# Patient Record
Sex: Female | Born: 1985 | Race: Black or African American | Hispanic: No | Marital: Married | Smoking: Never smoker
Health system: Southern US, Community
[De-identification: ages and names within clinical notes are randomized; demographics above are authoritative.]

## PROBLEM LIST (undated history)

## (undated) DIAGNOSIS — K219 Gastro-esophageal reflux disease without esophagitis: Secondary | ICD-10-CM

## (undated) DIAGNOSIS — I10 Essential (primary) hypertension: Secondary | ICD-10-CM

---

## 2016-07-05 ENCOUNTER — Encounter (HOSPITAL_BASED_OUTPATIENT_CLINIC_OR_DEPARTMENT_OTHER): Payer: Self-pay

## 2016-07-05 ENCOUNTER — Emergency Department (HOSPITAL_BASED_OUTPATIENT_CLINIC_OR_DEPARTMENT_OTHER): Payer: Worker's Compensation

## 2016-07-05 ENCOUNTER — Emergency Department (HOSPITAL_BASED_OUTPATIENT_CLINIC_OR_DEPARTMENT_OTHER)
Admission: EM | Admit: 2016-07-05 | Discharge: 2016-07-05 | Disposition: A | Discharge status: Home / Self Care | Payer: Worker's Compensation | Attending: Emergency Medicine | Admitting: Emergency Medicine

## 2016-07-05 DIAGNOSIS — W228XXA Striking against or struck by other objects, initial encounter: Secondary | ICD-10-CM | POA: Insufficient documentation

## 2016-07-05 DIAGNOSIS — I10 Essential (primary) hypertension: Secondary | ICD-10-CM | POA: Diagnosis not present

## 2016-07-05 DIAGNOSIS — M79641 Pain in right hand: Secondary | ICD-10-CM | POA: Diagnosis not present

## 2016-07-05 DIAGNOSIS — Y990 Civilian activity done for income or pay: Secondary | ICD-10-CM | POA: Insufficient documentation

## 2016-07-05 DIAGNOSIS — Y929 Unspecified place or not applicable: Secondary | ICD-10-CM | POA: Diagnosis not present

## 2016-07-05 DIAGNOSIS — Y9389 Activity, other specified: Secondary | ICD-10-CM | POA: Insufficient documentation

## 2016-07-05 HISTORY — DX: Essential (primary) hypertension: I10

## 2016-07-05 NOTE — ED Provider Notes (Signed)
[facility] DEPT Provider Note   CSN: [id number] Arrival date & time: 07/05/16  1240     History   Chief Complaint Chief Complaint  Patient presents with  . Hand Injury    HPI Emma Benjamin is a 30 y.o. female.  30 year old female hit her right hand on a window when she threw a newspaper outside of it. She has pain in the medial side of the right hand over the fifth metacarpal. She has minimal swelling but no ecchymosis. No decrease in range of motion aside from that which is associated with the pain. No injuries elsewhere. Hasn't taken anything for the symptoms. No exacerbating or relieving factors. No other associated symptoms.      Past Medical History:  Diagnosis Date  . Hypertension     There are no active problems to display for this patient.   Past Surgical History:  Procedure Laterality Date  . CESAREAN SECTION      OB History    No data available       Home Medications    Prior to Admission medications   Not on File    Family History No family history on file.  Social History Social History  Substance Use Topics  . Smoking status: Never Smoker  . Smokeless tobacco: Never Used  . Alcohol use No     Allergies   Flagyl [metronidazole]   Review of Systems Review of Systems  All other systems reviewed and are negative.    Physical Exam Updated Vital Signs BP 142/93 (BP Location: Left Arm)   Pulse 72   Temp 98.2 F (36.8 C) (Oral)   Resp 16   Ht 5\' 8"  (1.727 m)   Wt 180 lb 4 oz (81.8 kg)   LMP 05/29/2016   SpO2 100%   BMI 27.41 kg/m   Physical Exam  Constitutional: She is oriented to person, place, and time. She appears well-developed and well-nourished.  HENT:  Head: Normocephalic and atraumatic.  Neck: Normal range of motion.  Cardiovascular: Normal rate and regular rhythm.   Pulmonary/Chest: No stridor. No respiratory distress.  Abdominal: She exhibits no distension.  Musculoskeletal: Normal range of motion.  She exhibits tenderness (over fifth metacarpal of right hand). She exhibits no edema.  Neurological: She is alert and oriented to person, place, and time.  Able to fully flex/extend fingers on right hand. Normal sensation as well.   Skin: Skin is warm and dry.  Nursing note and vitals reviewed.    ED Treatments / Results  Labs (all labs ordered are listed, but only abnormal results are displayed) Labs Reviewed - No data to display  EKG  EKG Interpretation None       Radiology Dg Hand Complete Right  Result Date: 07/05/2016 CLINICAL DATA:  Blunt trauma right hand with fifth metacarpal pain, initial encounter EXAM: RIGHT HAND - COMPLETE 3+ VIEW COMPARISON:  None. FINDINGS: There is no evidence of fracture or dislocation. There is no evidence of arthropathy or other focal bone abnormality. Soft tissues are unremarkable. IMPRESSION: No acute abnormality noted. Electronically Signed   By: Alcide Clever M.D.   On: 07/05/2016 13:40    Procedures Procedures (including critical care time)  Medications Ordered in ED Medications - No data to display   Initial Impression / Assessment and Plan / ED Course  I have reviewed the triage vital signs and the nursing notes.  Pertinent labs & imaging results that were available during my care of the patient were reviewed by  me and considered in my medical decision making (see chart for details).  Clinical Course    X-ray without evidence of fracture however with exquisite tenderness was put in a splint and will follow-up in a week for repeat x-ray if still having pain.  Final Clinical Impressions(s) / ED Diagnoses   Final diagnoses:  Right hand pain    New Prescriptions There are no discharge medications for this patient.    Marily MemosJason Jachelle Fluty, MD 07/06/16 [phone]

## 2016-07-05 NOTE — ED Triage Notes (Signed)
Hit right hand on door frame at work approx 0430am today-NAD-steady gait

## 2016-07-05 NOTE — ED Notes (Signed)
Patient currently completing drug screen.

## 2016-07-05 NOTE — ED Notes (Signed)
UDS completed 

## 2017-10-13 IMAGING — CR DG HAND COMPLETE 3+V*R*
3 series · 3 of 3 positions shown · non-contrast
Comparison: None.

CLINICAL DATA: Blunt trauma right hand with fifth metacarpal pain,
initial encounter

EXAM:
RIGHT HAND - COMPLETE 3+ VIEW

[x hand pa right]
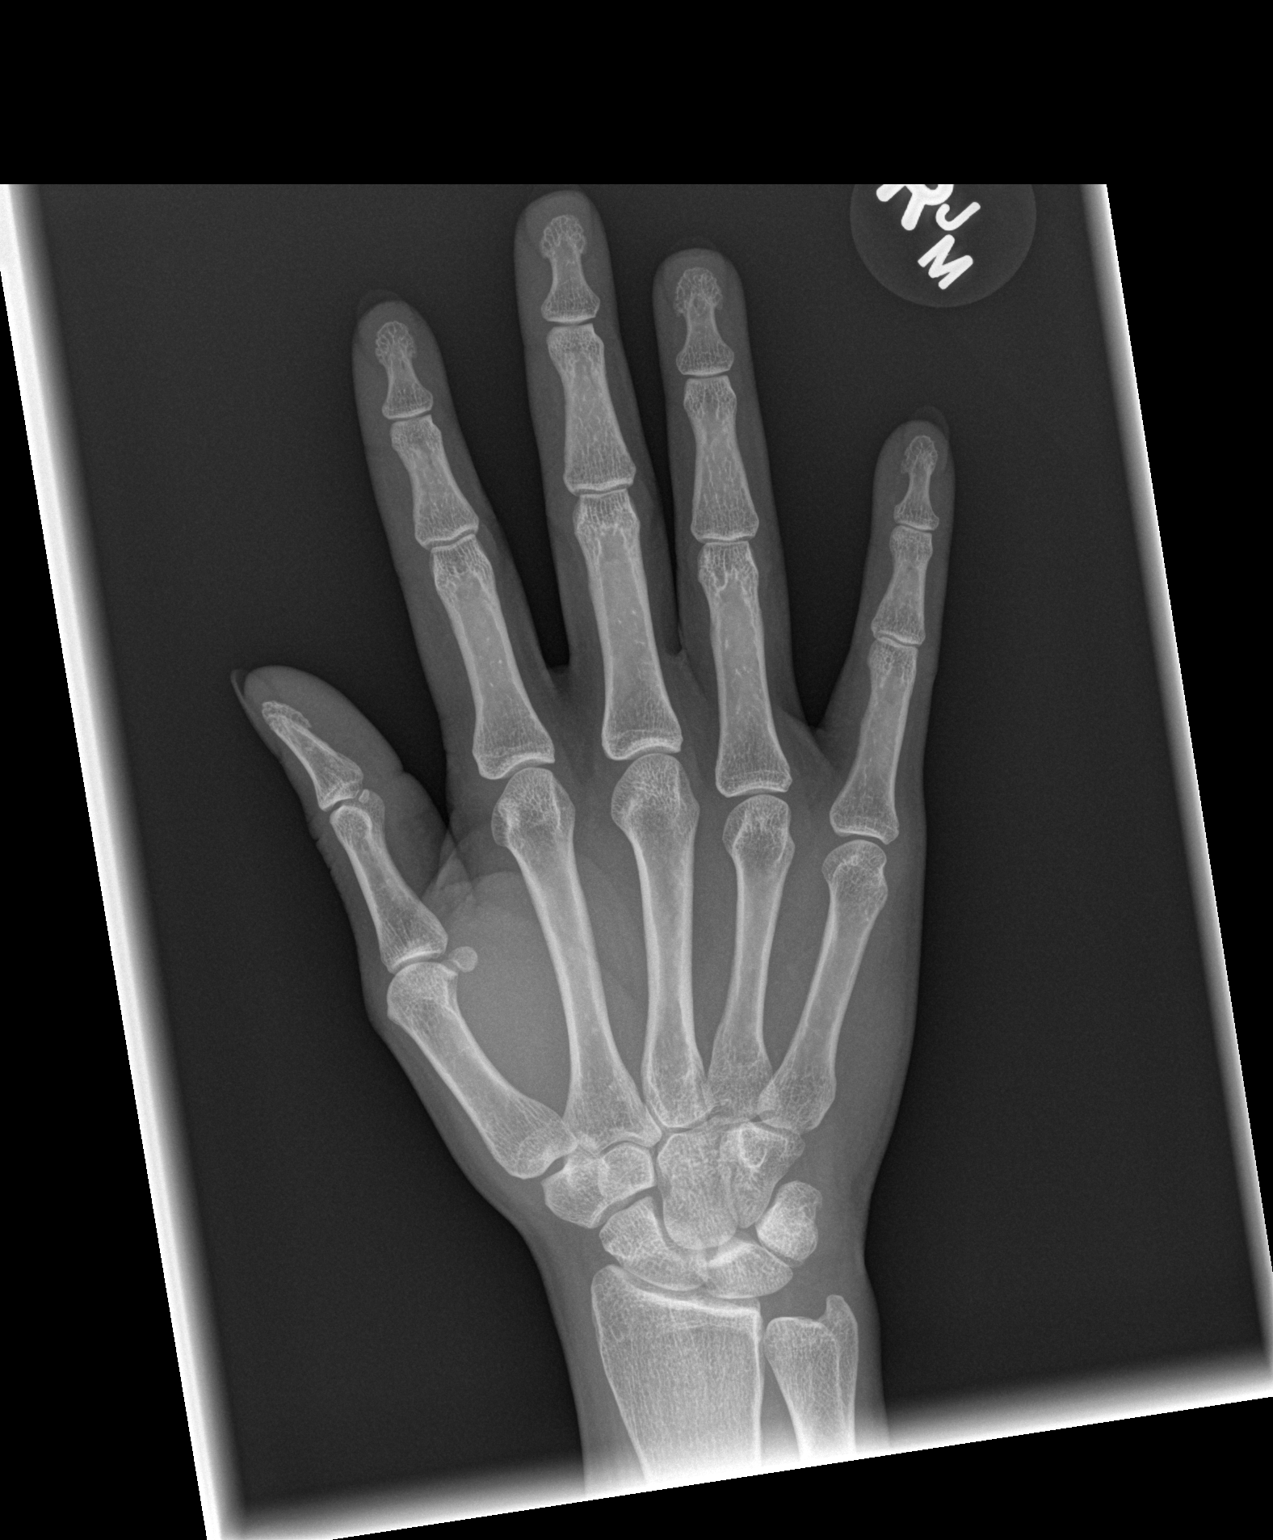

[x hand oblique right]
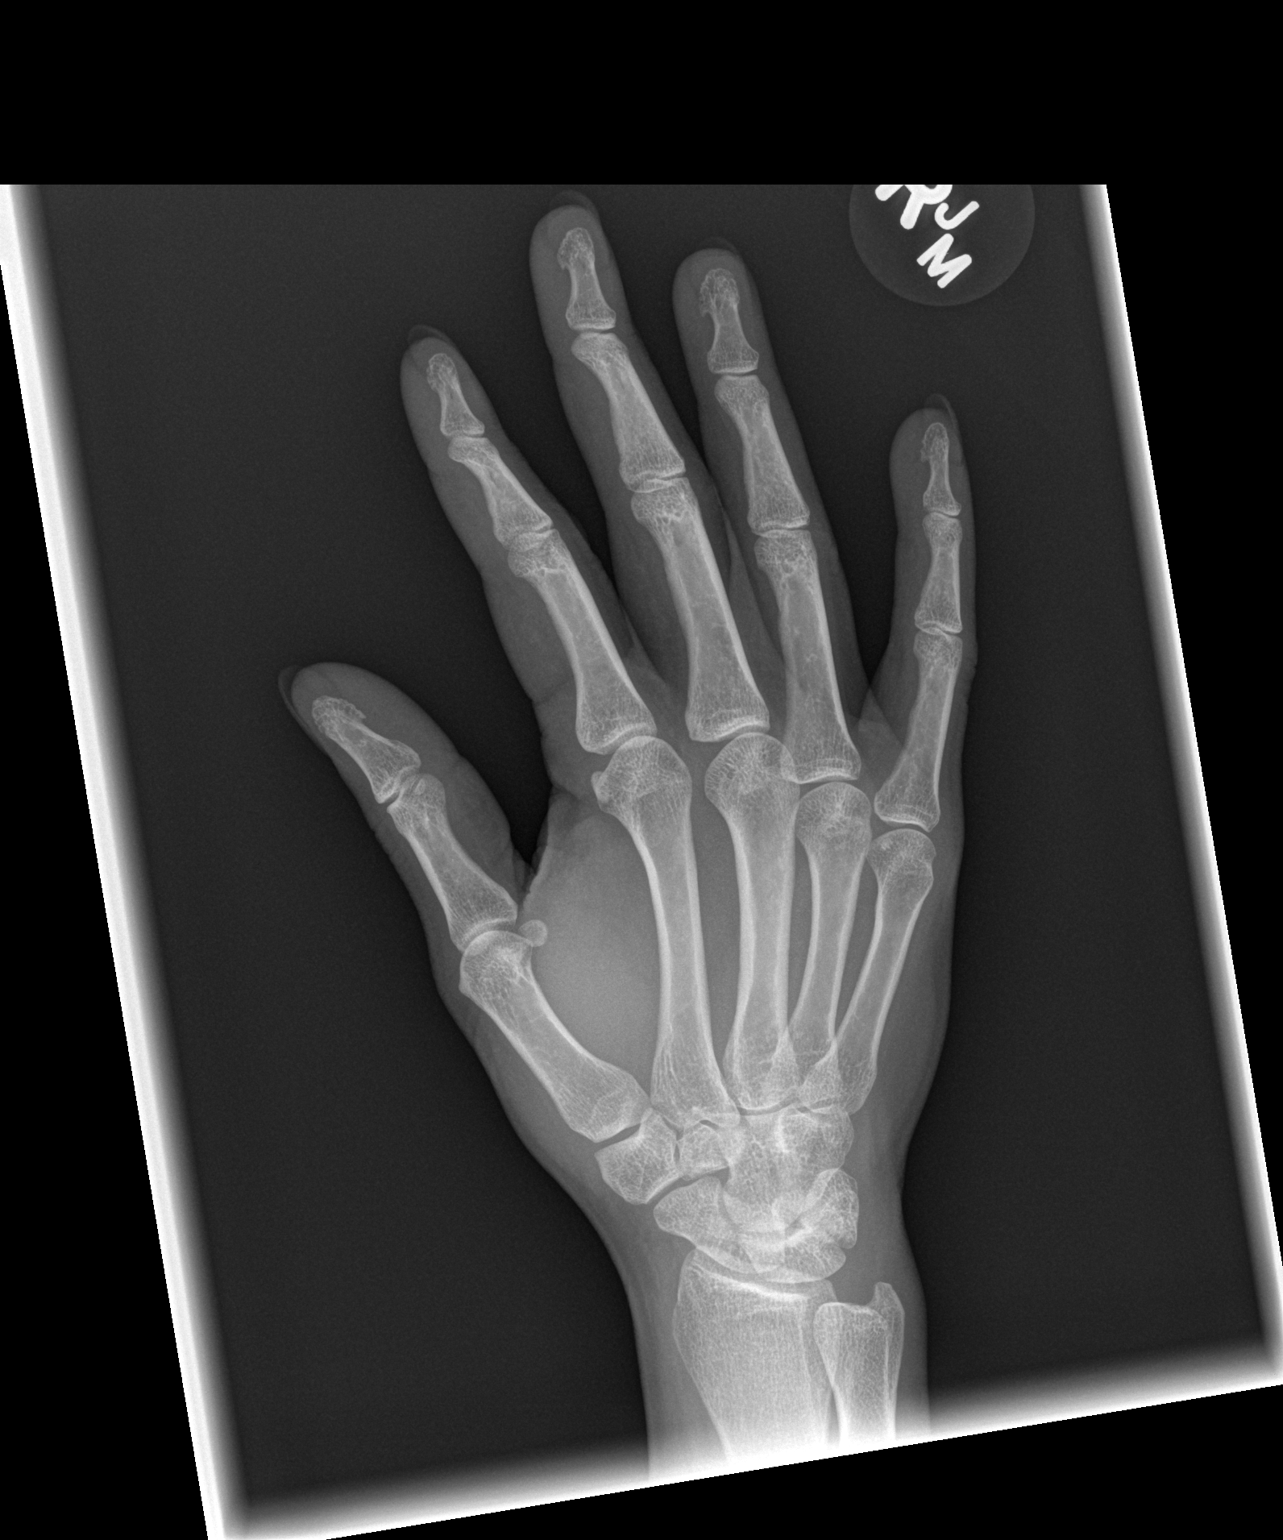

[x hand lat right]
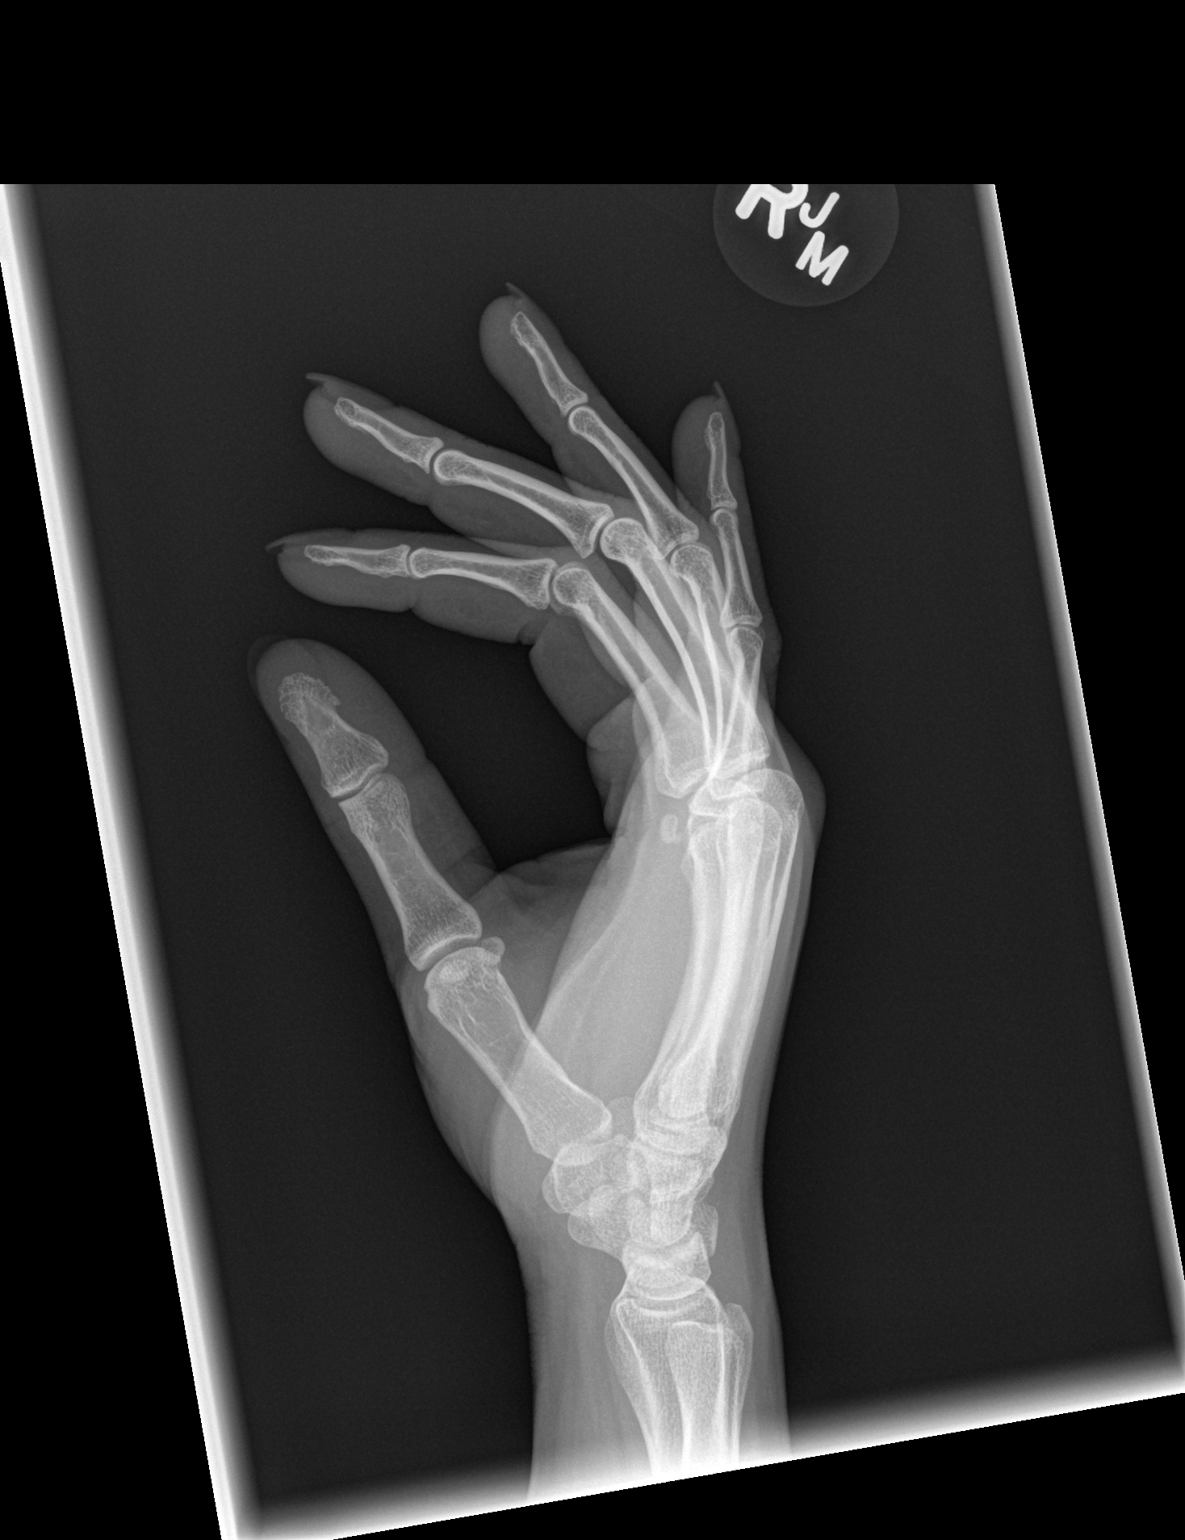

[3 of 3 positions shown; findings below may reference images not displayed]

FINDINGS: There is no evidence of fracture or dislocation. There is no
evidence of arthropathy or other focal bone abnormality. Soft
tissues are unremarkable.
IMPRESSION: No acute abnormality noted.

## 2021-02-05 ENCOUNTER — Emergency Department (HOSPITAL_BASED_OUTPATIENT_CLINIC_OR_DEPARTMENT_OTHER)
Admission: EM | Admit: 2021-02-05 | Discharge: 2021-02-06 | Disposition: A | Discharge status: Home / Self Care | Payer: BC Managed Care – PPO | Attending: Emergency Medicine | Admitting: Emergency Medicine

## 2021-02-05 ENCOUNTER — Encounter (HOSPITAL_BASED_OUTPATIENT_CLINIC_OR_DEPARTMENT_OTHER): Payer: Self-pay | Admitting: Emergency Medicine

## 2021-02-05 ENCOUNTER — Other Ambulatory Visit: Payer: Self-pay

## 2021-02-05 DIAGNOSIS — Z5321 Procedure and treatment not carried out due to patient leaving prior to being seen by health care provider: Secondary | ICD-10-CM | POA: Insufficient documentation

## 2021-02-05 DIAGNOSIS — R6884 Jaw pain: Secondary | ICD-10-CM | POA: Insufficient documentation

## 2021-02-05 MED ORDER — OXYCODONE-ACETAMINOPHEN 5-325 MG PO TABS
1.0000 | ORAL_TABLET | ORAL | Status: DC | PRN
  Administered 2021-02-05: 1 via ORAL
  Filled 2021-02-05: qty 1

## 2021-02-05 NOTE — ED Triage Notes (Signed)
Reports pain in the left jaw from a bad tooth with loose filling.  Started last night.  Taking tylenol with no relief.  Reports unable to take ibuprofen due to htn medications.

## 2021-02-06 NOTE — ED Notes (Signed)
Walked out of the department.  Told the secretary she couldn't wait any longer.

## 2022-09-28 ENCOUNTER — Emergency Department (HOSPITAL_BASED_OUTPATIENT_CLINIC_OR_DEPARTMENT_OTHER)
Admission: EM | Admit: 2022-09-28 | Discharge: 2022-09-28 | Discharge status: Against Medical Advice | Payer: BC Managed Care – PPO | Attending: Emergency Medicine | Admitting: Emergency Medicine

## 2022-09-28 ENCOUNTER — Other Ambulatory Visit: Payer: Self-pay

## 2022-09-28 ENCOUNTER — Encounter (HOSPITAL_BASED_OUTPATIENT_CLINIC_OR_DEPARTMENT_OTHER): Payer: Self-pay | Admitting: Emergency Medicine

## 2022-09-28 DIAGNOSIS — Z5321 Procedure and treatment not carried out due to patient leaving prior to being seen by health care provider: Secondary | ICD-10-CM | POA: Insufficient documentation

## 2022-09-28 DIAGNOSIS — R519 Headache, unspecified: Secondary | ICD-10-CM | POA: Insufficient documentation

## 2022-09-28 DIAGNOSIS — R42 Dizziness and giddiness: Secondary | ICD-10-CM | POA: Diagnosis not present

## 2022-09-28 DIAGNOSIS — R110 Nausea: Secondary | ICD-10-CM | POA: Insufficient documentation

## 2022-09-28 DIAGNOSIS — R55 Syncope and collapse: Secondary | ICD-10-CM | POA: Diagnosis not present

## 2022-09-28 HISTORY — DX: Gastro-esophageal reflux disease without esophagitis: K21.9

## 2022-09-28 NOTE — ED Triage Notes (Addendum)
Pt has been having dizziness/lightheadedness since 10 pm, started to have a headache at 2 am.    Pt had near syncopal event.  Some nausea but no V/D.
